# Patient Record
Sex: Male | Born: 1980 | Race: White | Marital: Married | Smoking: Never smoker
Health system: Southern US, Community
[De-identification: ages and names within clinical notes are randomized; demographics above are authoritative.]

## PROBLEM LIST (undated history)

## (undated) HISTORY — PX: CHOLECYSTECTOMY: SHX55

## (undated) HISTORY — PX: FINGER AMPUTATION: SHX636

---

## 2012-12-10 ENCOUNTER — Other Ambulatory Visit: Payer: Self-pay | Admitting: Occupational Medicine

## 2012-12-10 ENCOUNTER — Ambulatory Visit
Admission: RE | Admit: 2012-12-10 | Discharge: 2012-12-10 | Disposition: A | Discharge status: Home / Self Care | Payer: No Typology Code available for payment source | Source: Ambulatory Visit | Attending: Occupational Medicine | Admitting: Occupational Medicine

## 2012-12-10 DIAGNOSIS — Z021 Encounter for pre-employment examination: Secondary | ICD-10-CM

## 2020-03-21 ENCOUNTER — Other Ambulatory Visit: Payer: Self-pay

## 2020-03-21 ENCOUNTER — Encounter (HOSPITAL_BASED_OUTPATIENT_CLINIC_OR_DEPARTMENT_OTHER): Payer: Self-pay | Admitting: *Deleted

## 2020-03-21 DIAGNOSIS — R1030 Lower abdominal pain, unspecified: Secondary | ICD-10-CM | POA: Diagnosis present

## 2020-03-21 DIAGNOSIS — R7401 Elevation of levels of liver transaminase levels: Secondary | ICD-10-CM | POA: Diagnosis not present

## 2020-03-21 DIAGNOSIS — K529 Noninfective gastroenteritis and colitis, unspecified: Secondary | ICD-10-CM | POA: Insufficient documentation

## 2020-03-21 LAB — LIPASE, BLOOD: Lipase: 24 U/L (ref 11–51)

## 2020-03-21 LAB — CBC WITH DIFFERENTIAL/PLATELET
Abs Immature Granulocytes: 0.03 10*3/uL (ref 0.00–0.07)
Basophils Absolute: 0 10*3/uL (ref 0.0–0.1)
Basophils Relative: 0 %
Eosinophils Absolute: 0.1 10*3/uL (ref 0.0–0.5)
Eosinophils Relative: 2 %
HCT: 47.6 % (ref 39.0–52.0)
Hemoglobin: 17.1 g/dL — ABNORMAL HIGH (ref 13.0–17.0)
Immature Granulocytes: 0 %
Lymphocytes Relative: 24 %
Lymphs Abs: 2.1 10*3/uL (ref 0.7–4.0)
MCH: 30 pg (ref 26.0–34.0)
MCHC: 35.9 g/dL (ref 30.0–36.0)
MCV: 83.5 fL (ref 80.0–100.0)
Monocytes Absolute: 1.4 10*3/uL — ABNORMAL HIGH (ref 0.1–1.0)
Monocytes Relative: 16 %
Neutro Abs: 5.3 10*3/uL (ref 1.7–7.7)
Neutrophils Relative %: 58 %
Platelets: 198 10*3/uL (ref 150–400)
RBC: 5.7 MIL/uL (ref 4.22–5.81)
RDW: 12.4 % (ref 11.5–15.5)
WBC: 9.1 10*3/uL (ref 4.0–10.5)
nRBC: 0 % (ref 0.0–0.2)

## 2020-03-21 LAB — COMPREHENSIVE METABOLIC PANEL
ALT: 71 U/L — ABNORMAL HIGH (ref 0–44)
AST: 37 U/L (ref 15–41)
Albumin: 4.6 g/dL (ref 3.5–5.0)
Alkaline Phosphatase: 68 U/L (ref 38–126)
Anion gap: 12 (ref 5–15)
BUN: 23 mg/dL — ABNORMAL HIGH (ref 6–20)
CO2: 24 mmol/L (ref 22–32)
Calcium: 9.6 mg/dL (ref 8.9–10.3)
Chloride: 102 mmol/L (ref 98–111)
Creatinine, Ser: 1.15 mg/dL (ref 0.61–1.24)
GFR, Estimated: >60 mL/min (ref >60)
Glucose, Bld: 133 mg/dL — ABNORMAL HIGH (ref 70–99)
Potassium: 3.5 mmol/L (ref 3.5–5.1)
Sodium: 138 mmol/L (ref 135–145)
Total Bilirubin: 2.4 mg/dL — ABNORMAL HIGH (ref 0.3–1.2)
Total Protein: 7.7 g/dL (ref 6.5–8.1)

## 2020-03-21 NOTE — ED Triage Notes (Signed)
C/o abd pain n/v.d  x 3 days, sent here from [facility] for r/o pancreatitis

## 2020-03-22 ENCOUNTER — Encounter (HOSPITAL_BASED_OUTPATIENT_CLINIC_OR_DEPARTMENT_OTHER): Payer: Self-pay | Admitting: Radiology

## 2020-03-22 ENCOUNTER — Emergency Department (HOSPITAL_BASED_OUTPATIENT_CLINIC_OR_DEPARTMENT_OTHER): Payer: 59

## 2020-03-22 ENCOUNTER — Emergency Department (HOSPITAL_BASED_OUTPATIENT_CLINIC_OR_DEPARTMENT_OTHER)
Admission: EM | Admit: 2020-03-22 | Discharge: 2020-03-22 | Disposition: A | Discharge status: Home / Self Care | Payer: 59 | Attending: Emergency Medicine | Admitting: Emergency Medicine

## 2020-03-22 DIAGNOSIS — R17 Unspecified jaundice: Secondary | ICD-10-CM

## 2020-03-22 DIAGNOSIS — R7401 Elevation of levels of liver transaminase levels: Secondary | ICD-10-CM

## 2020-03-22 DIAGNOSIS — K529 Noninfective gastroenteritis and colitis, unspecified: Secondary | ICD-10-CM

## 2020-03-22 MED ORDER — ONDANSETRON HCL 4 MG PO TABS: 4.0000 mg | ORAL_TABLET | Freq: Four times a day (QID) | ORAL | 0 refills | Status: AC | PRN

## 2020-03-22 MED ORDER — ONDANSETRON HCL 4 MG/2ML IJ SOLN
4.0000 mg | Freq: Once | INTRAMUSCULAR | Status: AC
  Administered 2020-03-22: 02:00:00 4 mg via INTRAVENOUS
  Filled 2020-03-22: qty 2

## 2020-03-22 MED ORDER — LOPERAMIDE HCL 2 MG PO CAPS
4.0000 mg | ORAL_CAPSULE | Freq: Once | ORAL | Status: AC
  Administered 2020-03-22: 04:00:00 4 mg via ORAL
  Filled 2020-03-22: qty 2

## 2020-03-22 MED ORDER — SODIUM CHLORIDE 0.9 % IV BOLUS
1000.0000 mL | Freq: Once | INTRAVENOUS | Status: AC
  Administered 2020-03-22: 02:00:00 1000 mL via INTRAVENOUS

## 2020-03-22 MED ORDER — IOHEXOL 300 MG/ML  SOLN
100.0000 mL | Freq: Once | INTRAMUSCULAR | Status: AC | PRN
  Administered 2020-03-22: 03:00:00 100 mL via INTRAVENOUS

## 2020-03-22 NOTE — Discharge Instructions (Addendum)
Take loperamide (Imodium AD) as needed for diarrhea.  Return if symptoms are getting worse. 

## 2020-03-22 NOTE — ED Provider Notes (Signed)
[facility] EMERGENCY DEPARTMENT Provider Note   CSN: [id number] Arrival date & time: 03/21/20  2038   History Chief Complaint  Patient presents with  . Abdominal Pain    Jared Vega is a 39 y.o. male.  The history is provided by the patient.  Abdominal Pain He has history of cholecystectomy and comes in complaining of epigastric pain, vomiting, diarrhea for the last 3 days.  He estimates 7 or 8 episodes of emesis and 5 or 6 loose bowel movements today.  Abdominal pain is rated at 6/10.  He has noted some bloating of the upper abdomen which improves after he vomits.  He denies fever, chills, sweats.  He denies any sick contacts.  History reviewed. No pertinent past medical history.  There are no problems to display for this patient.   Past Surgical History:  Procedure Laterality Date  . CHOLECYSTECTOMY    . FINGER AMPUTATION         No family history on file.  Social History   Tobacco Use  . Smoking status: Never Smoker  Substance Use Topics  . Alcohol use: Not Currently    Home Medications Prior to Admission medications   Not on File    Allergies    Patient has no known allergies.  Review of Systems   Review of Systems  Gastrointestinal: Positive for abdominal pain.  All other systems reviewed and are negative.   Physical Exam Updated Vital Signs BP 132/89 (BP Location: Right Arm)   Pulse 69   Temp 97.8 F (36.6 C) (Oral)   Resp 18   Ht 6\' 2"  (1.88 m)   Wt 113.4 kg   SpO2 100%   BMI 32.10 kg/m   Physical Exam Vitals and nursing note reviewed.   39 year old male, resting comfortably and in no acute distress. Vital signs are normal. Oxygen saturation is 100%, which is normal. Head is normocephalic and atraumatic. PERRLA, EOMI. Oropharynx is clear. Neck is nontender and supple without adenopathy or JVD. Back is nontender and there is no CVA tenderness. Lungs are clear without rales, wheezes, or rhonchi. Chest is  nontender. Heart has regular rate and rhythm without murmur. Abdomen is soft, flat, with mild to moderate epigastric tenderness.  There is no rebound or guarding.  There are no masses or hepatosplenomegaly and peristalsis is hypoactive but present. Extremities have no cyanosis or edema, full range of motion is present. Skin is warm and dry without rash. Neurologic: Mental status is normal, cranial nerves are intact, there are no motor or sensory deficits.  ED Results / Procedures / Treatments   Labs (all labs ordered are listed, but only abnormal results are displayed) Labs Reviewed  CBC WITH DIFFERENTIAL/PLATELET - Abnormal; Notable for the following components:      Result Value   Hemoglobin 17.1 (*)    Monocytes Absolute 1.4 (*)    All other components within normal limits  COMPREHENSIVE METABOLIC PANEL - Abnormal; Notable for the following components:   Glucose, Bld 133 (*)    BUN 23 (*)    ALT 71 (*)    Total Bilirubin 2.4 (*)    All other components within normal limits  LIPASE, BLOOD    EKG None  Radiology CT ABDOMEN PELVIS W CONTRAST  Result Date: 03/22/2020 CLINICAL DATA:  Abdominal pain nausea vomiting EXAM: CT ABDOMEN AND PELVIS WITH CONTRAST TECHNIQUE: Multidetector CT imaging of the abdomen and pelvis was performed using the standard protocol following bolus administration of intravenous contrast.  CONTRAST:  OMNIPAQUE IOHEXOL 300 MG/ML  SOLN COMPARISON:  None. FINDINGS: Lower chest: The visualized heart size within normal limits. No pericardial fluid/thickening. No hiatal hernia. The visualized portions of the lungs are clear. Hepatobiliary: The liver is normal in density without focal abnormality.The main portal vein is patent. No evidence of calcified gallstones, gallbladder wall thickening or biliary dilatation. Pancreas: Unremarkable. No pancreatic ductal dilatation or surrounding inflammatory changes. Spleen: Normal in size without focal abnormality.  Adrenals/Urinary Tract: Both adrenal glands appear normal. The kidneys and collecting system appear normal without evidence of urinary tract calculus or hydronephrosis. Bladder is unremarkable. Stomach/Bowel: The stomach is unremarkable. There is scattered loops of jejunum and mid ileum which are mildly prominent with fluid and air. There is mild mesenteric edema and wall thickening seen within the loops of bowel. No focal transition point is seen. The colon is unremarkable. No could be clues over glucose Vascular/Lymphatic: There are no enlarged mesenteric, retroperitoneal, or pelvic lymph nodes. No significant vascular findings are present. Reproductive: The prostate is unremarkable. Other: No evidence of abdominal wall mass or hernia. Musculoskeletal: No acute or significant osseous findings. IMPRESSION: Findings which could be suggestive of enteritis involving several jejunal and ileal loops. They appear to be mildly prominent, however no definite evidence of small-bowel obstruction. Electronically Signed   By: Jonna Clark M.D.   On: 03/22/2020 03:10    Procedures Procedures   Medications Ordered in ED Medications  loperamide (IMODIUM) capsule 4 mg (has no administration in time range)  sodium chloride 0.9 % bolus 1,000 mL (1,000 mLs Intravenous New Bag/Given 03/22/20 0228)  ondansetron (ZOFRAN) injection 4 mg (4 mg Intravenous Given 03/22/20 0228)  iohexol (OMNIPAQUE) 300 MG/ML solution 100 mL (100 mLs Intravenous Contrast Given 03/22/20 0256)    ED Course  I have reviewed the triage vital signs and the nursing notes.  Pertinent labs & imaging results that were available during my care of the patient were reviewed by me and considered in my medical decision making (see chart for details).  MDM Rules/Calculators/A&P Nausea, vomiting, diarrhea.  This may be a viral gastroenteritis, but with history of abdominal surgery and reported distention, need to consider possible partial small bowel  obstruction.  Labs showed normal WBC but mild elevation of ALT and moderate elevation of bilirubin.  Old records are reviewed, and he did have an elevation in bilirubin in 2019 which had resolved with follow-up metabolic panel.  No prior elevation of ALT.  He will be given IV fluids, ondansetron and will send for CT abdomen and pelvis to rule out partial small bowel obstruction.  CT is consistent with enteritis, no evidence of bowel obstruction.  Nausea is improved following ondansetron.  He is discharged with prescription for ondansetron, told to use over-the-counter loperamide for diarrhea.  Return precautions discussed.  Final Clinical Impression(s) / ED Diagnoses Final diagnoses:  Gastroenteritis  Elevated ALT measurement  Serum total bilirubin elevated    Rx / DC Orders ED Discharge Orders         Ordered    ondansetron (ZOFRAN) 4 MG tablet  Every 6 hours PRN        03/22/20 0328           Dione Booze, MD 03/22/20 [phone]

## 2020-03-22 NOTE — ED Notes (Signed)
ED Provider at bedside. 

## 2021-03-21 ENCOUNTER — Other Ambulatory Visit: Payer: Self-pay

## 2021-03-21 ENCOUNTER — Ambulatory Visit
Admission: RE | Admit: 2021-03-21 | Discharge: 2021-03-21 | Disposition: A | Discharge status: Home / Self Care | Payer: No Typology Code available for payment source | Source: Ambulatory Visit | Attending: Nurse Practitioner | Admitting: Nurse Practitioner

## 2021-03-21 ENCOUNTER — Other Ambulatory Visit: Payer: Self-pay | Admitting: Nurse Practitioner

## 2021-03-21 DIAGNOSIS — Z021 Encounter for pre-employment examination: Secondary | ICD-10-CM

## 2021-05-03 IMAGING — CT CT ABD-PELV W/ CM
2 of 4 series · 16 of 46 positions shown, 18 images · IV contrast (Omnipaque)
Comparison: None.

CLINICAL DATA: Abdominal pain nausea vomiting

EXAM:
CT ABDOMEN AND PELVIS WITH CONTRAST
TECHNIQUE: Multidetector CT imaging of the abdomen and pelvis was performed
using the standard protocol following bolus administration of
intravenous contrast.
CONTRAST:  100mL OMNIPAQUE IOHEXOL 300 MG/ML  SOLN

[Series 2: axial st · axial · 0.79mm/px · z∈[+690,+1150]mm · 13 of 102 slices shown, 15 images]
[im 5/102  soft-tissue]
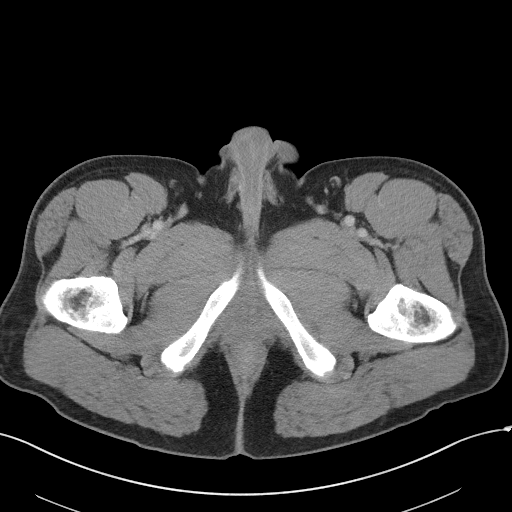
[im 5/102  bone]
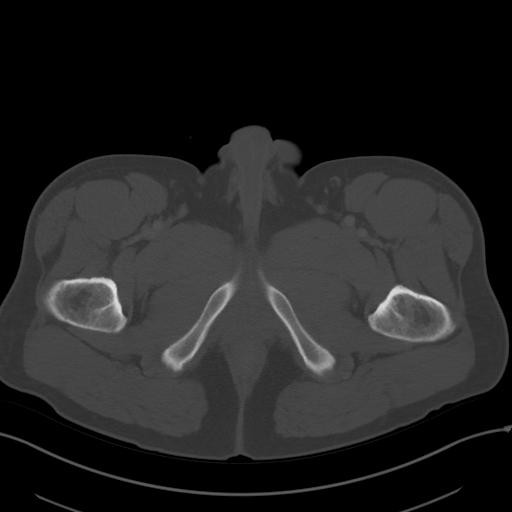
[im 14/102  soft-tissue]
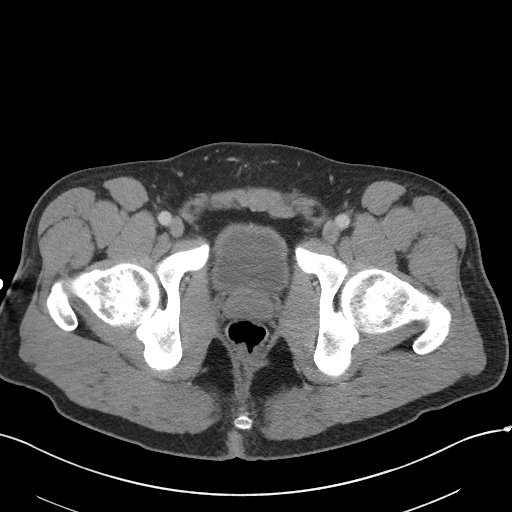
[im 22/102  soft-tissue]
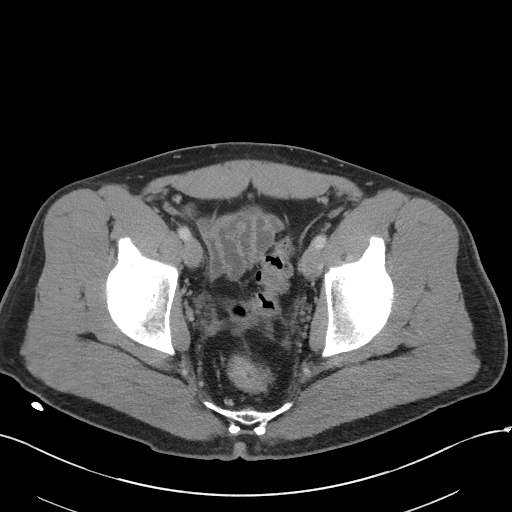
[im 27/102  soft-tissue]
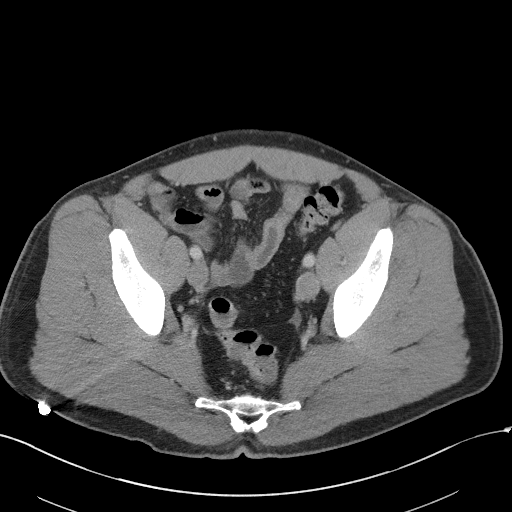
[im 36/102  soft-tissue]
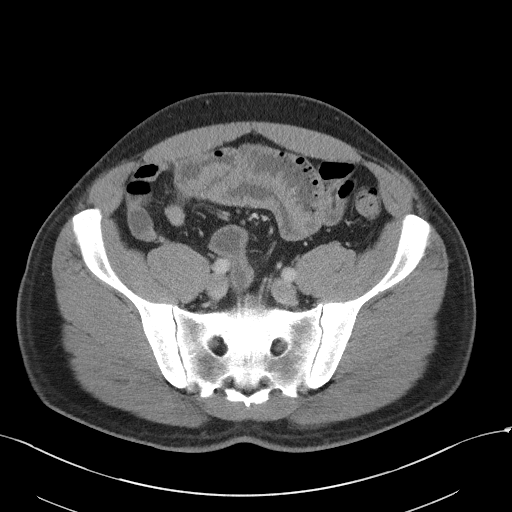
[im 44/102  soft-tissue]
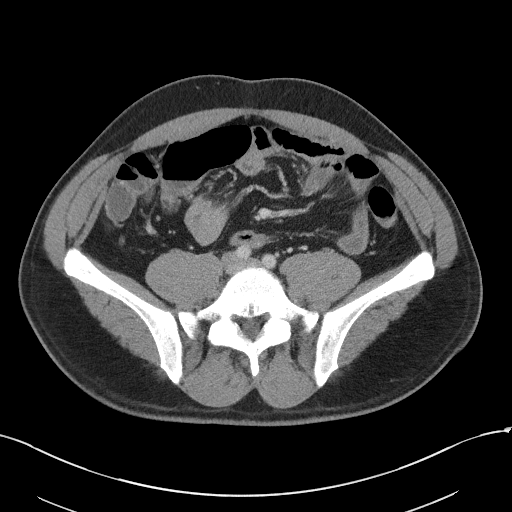
[im 53/102  soft-tissue]
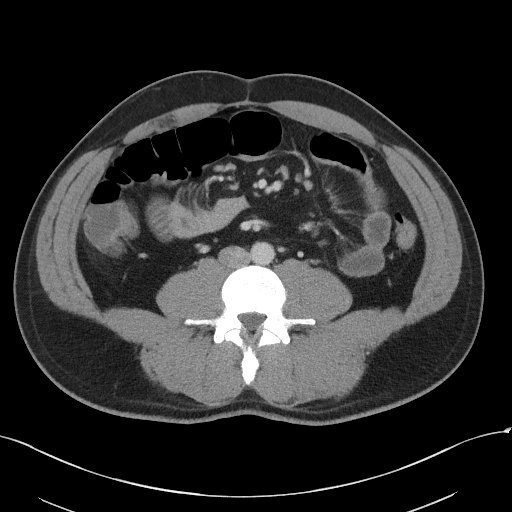
[im 58/102  soft-tissue]
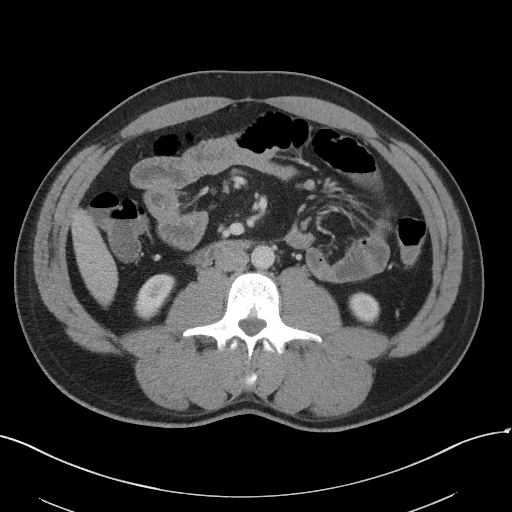
[im 66/102  soft-tissue]
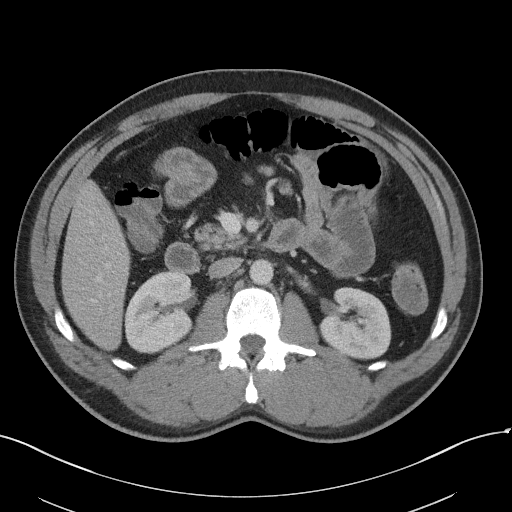
[im 66/102  bone]
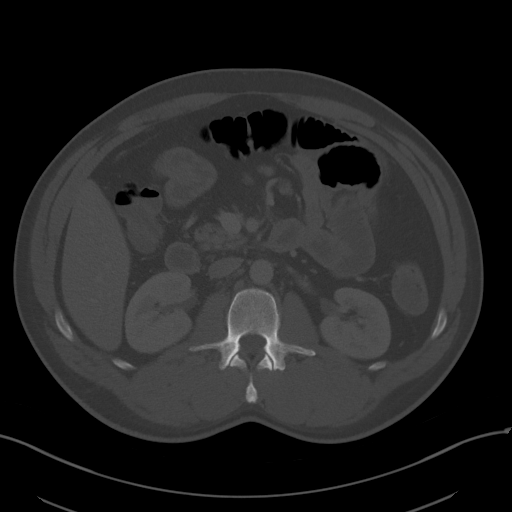
[im 75/102  soft-tissue]
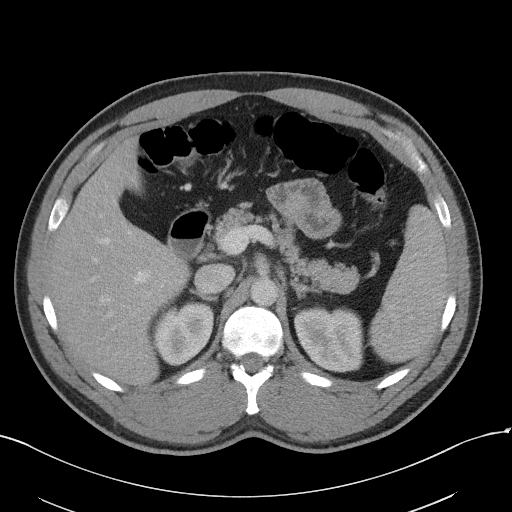
[im 80/102  soft-tissue]
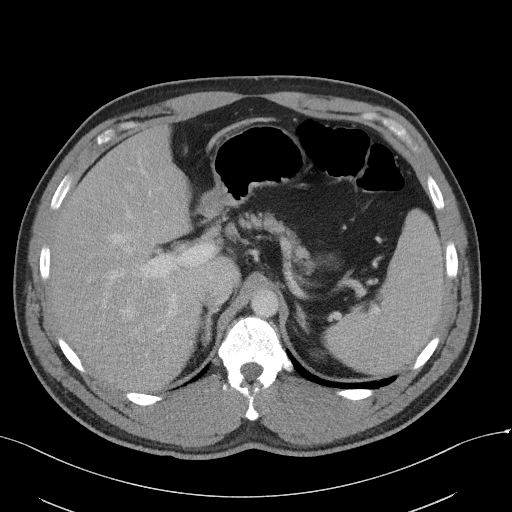
[im 88/102  soft-tissue]
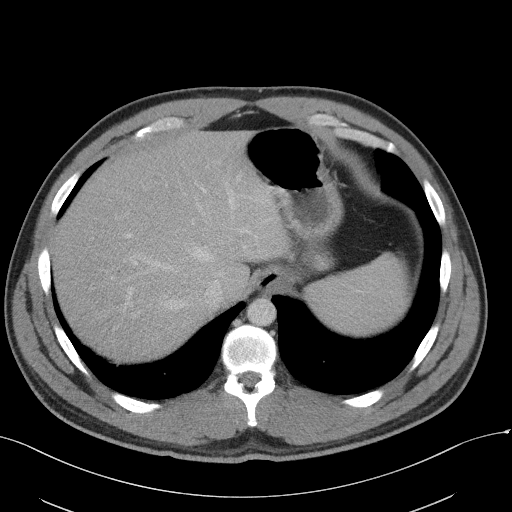
[im 97/102  soft-tissue]
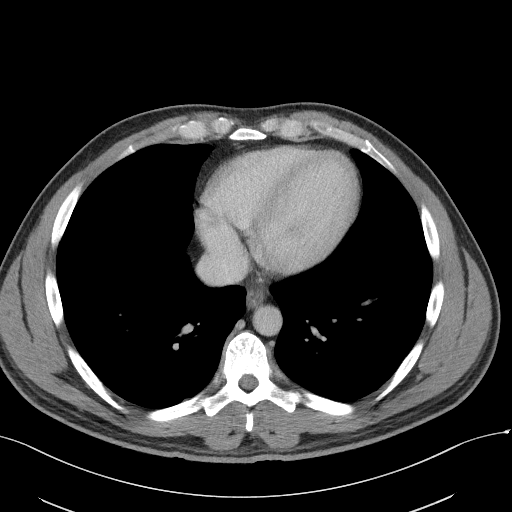

[Series 5: coronal st · coronal · 0.86mm/px · 3 of 97 slices shown]
[im 33/97  soft-tissue]
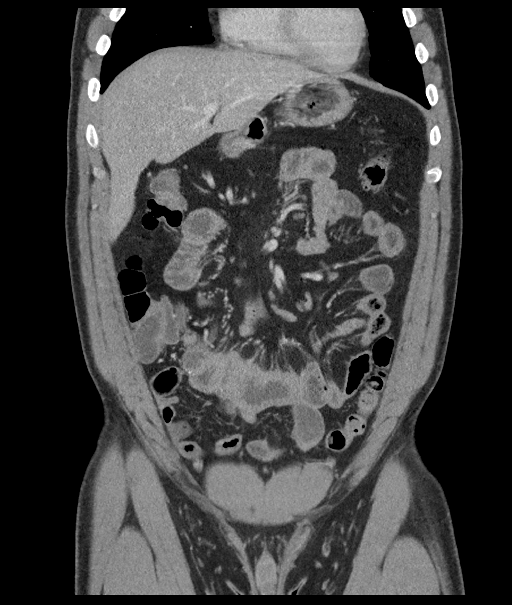
[im 43/97  soft-tissue]
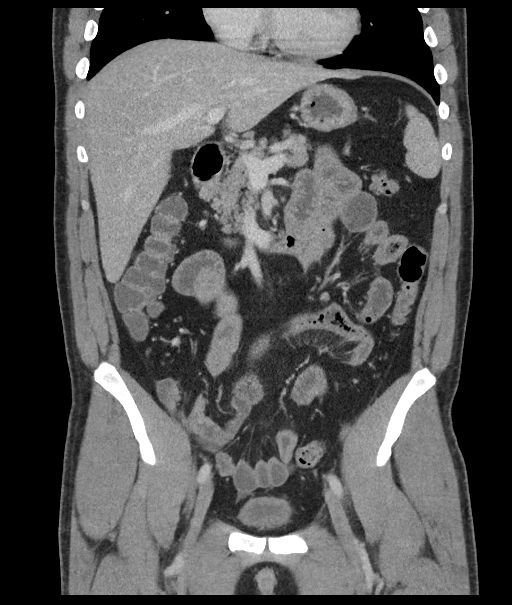
[im 54/97  soft-tissue]
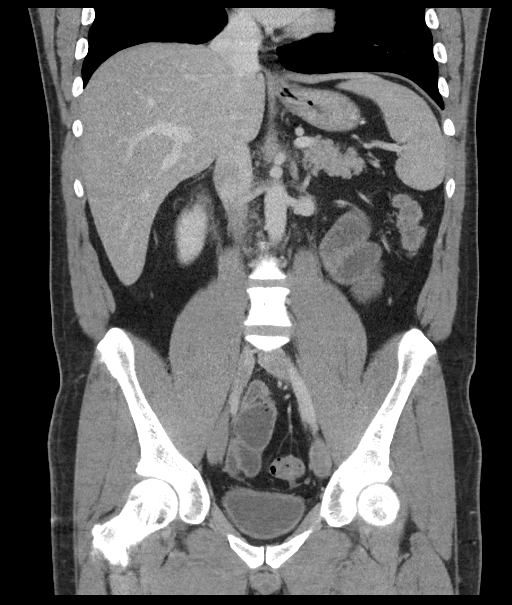

[16 of 46 positions shown; findings below may reference images not displayed]

FINDINGS: Lower chest: The visualized heart size within normal limits. No
pericardial fluid/thickening.

No hiatal hernia.

The visualized portions of the lungs are clear.

Hepatobiliary: The liver is normal in density without focal
abnormality.The main portal vein is patent. No evidence of calcified
gallstones, gallbladder wall thickening or biliary dilatation.

Pancreas: Unremarkable. No pancreatic ductal dilatation or
surrounding inflammatory changes.

Spleen: Normal in size without focal abnormality.

Adrenals/Urinary Tract: Both adrenal glands appear normal. The
kidneys and collecting system appear normal without evidence of
urinary tract calculus or hydronephrosis. Bladder is unremarkable.

Stomach/Bowel: The stomach is unremarkable. There is scattered loops
of jejunum and mid ileum which are mildly prominent with fluid and
air. There is mild mesenteric edema and wall thickening seen within
the loops of bowel. No focal transition point is seen. The colon is
unremarkable. No could be clues over glucose

Vascular/Lymphatic: There are no enlarged mesenteric,
retroperitoneal, or pelvic lymph nodes. No significant vascular
findings are present.

Reproductive: The prostate is unremarkable.

Other: No evidence of abdominal wall mass or hernia.

Musculoskeletal: No acute or significant osseous findings.
IMPRESSION: Findings which could be suggestive of enteritis involving several
jejunal and ileal loops. They appear to be mildly prominent, however
no definite evidence of small-bowel obstruction.

## 2022-05-02 IMAGING — CR DG CHEST 1V
1 series · 1 of 1 positions shown · non-contrast
Comparison: Radiographs 12/10/2012.

CLINICAL DATA: Pre-employment examination. No current complaints.
Former smoker.

EXAM:
CHEST  1 VIEW

[w chest pa]
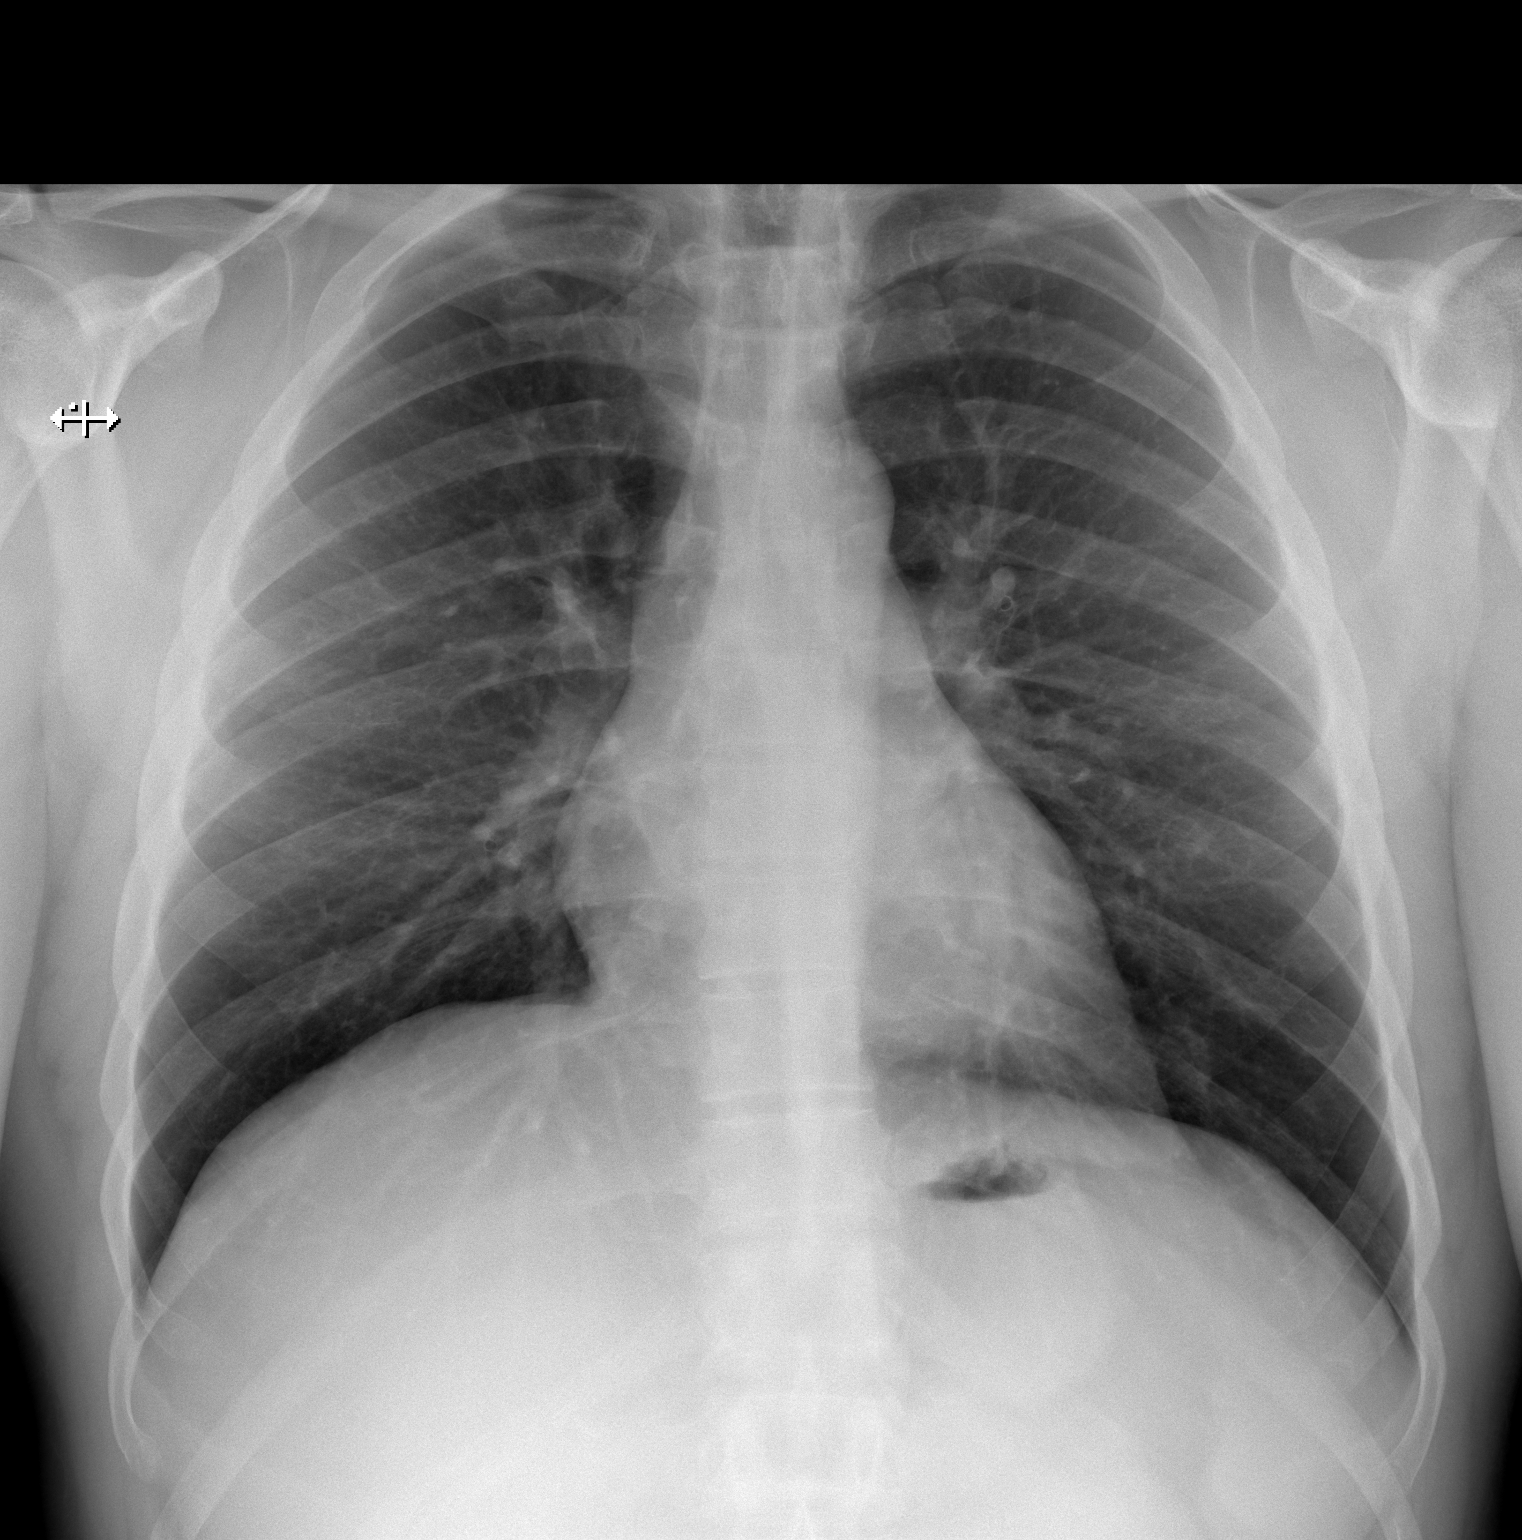

[1 of 1 positions shown; findings below may reference images not displayed]

FINDINGS: The heart size and mediastinal contours are normal. The lungs are
clear. There is no pleural effusion or pneumothorax. No acute
osseous findings are identified.
IMPRESSION: Stable chest.  No active cardiopulmonary process.

## 2024-01-12 ENCOUNTER — Other Ambulatory Visit (HOSPITAL_BASED_OUTPATIENT_CLINIC_OR_DEPARTMENT_OTHER): Payer: Self-pay | Admitting: Family Medicine

## 2024-01-12 DIAGNOSIS — Z8249 Family history of ischemic heart disease and other diseases of the circulatory system: Secondary | ICD-10-CM

## 2024-02-15 ENCOUNTER — Ambulatory Visit (HOSPITAL_BASED_OUTPATIENT_CLINIC_OR_DEPARTMENT_OTHER)
Admission: RE | Admit: 2024-02-15 | Discharge: 2024-02-15 | Disposition: A | Discharge status: Home / Self Care | Payer: Self-pay | Source: Ambulatory Visit | Attending: Family Medicine | Admitting: Family Medicine

## 2024-02-15 DIAGNOSIS — Z8249 Family history of ischemic heart disease and other diseases of the circulatory system: Secondary | ICD-10-CM
